# Patient Record
Sex: Female | Born: 1945 | Hispanic: No | Smoking: Never smoker
Health system: Southern US, Community
[De-identification: ages and names within clinical notes are randomized; demographics above are authoritative.]

---

## 2008-05-21 ENCOUNTER — Encounter (INDEPENDENT_AMBULATORY_CARE_PROVIDER_SITE_OTHER): Payer: Self-pay | Admitting: *Deleted

## 2008-05-26 ENCOUNTER — Encounter (INDEPENDENT_AMBULATORY_CARE_PROVIDER_SITE_OTHER): Payer: Self-pay | Admitting: Family Medicine

## 2008-05-26 ENCOUNTER — Ambulatory Visit (HOSPITAL_COMMUNITY): Admission: RE | Admit: 2008-05-26 | Discharge: 2008-05-26 | Payer: Self-pay | Admitting: Family Medicine

## 2008-05-26 ENCOUNTER — Ambulatory Visit: Payer: Self-pay | Admitting: Family Medicine

## 2008-05-26 DIAGNOSIS — R141 Gas pain: Secondary | ICD-10-CM

## 2008-05-26 DIAGNOSIS — R142 Eructation: Secondary | ICD-10-CM

## 2008-05-26 DIAGNOSIS — I10 Essential (primary) hypertension: Secondary | ICD-10-CM

## 2008-05-26 DIAGNOSIS — R143 Flatulence: Secondary | ICD-10-CM

## 2008-05-26 LAB — CONVERTED CEMR LAB
BUN: 13 mg/dL (ref 6–23)
Chloride: 106 meq/L (ref 96–112)
Potassium: 3.6 meq/L (ref 3.5–5.3)

## 2008-05-27 ENCOUNTER — Telehealth: Payer: Self-pay | Admitting: *Deleted

## 2008-06-02 ENCOUNTER — Encounter (INDEPENDENT_AMBULATORY_CARE_PROVIDER_SITE_OTHER): Payer: Self-pay | Admitting: Family Medicine

## 2008-06-02 ENCOUNTER — Ambulatory Visit: Payer: Self-pay | Admitting: Family Medicine

## 2008-06-02 DIAGNOSIS — M1710 Unilateral primary osteoarthritis, unspecified knee: Secondary | ICD-10-CM

## 2008-06-24 ENCOUNTER — Ambulatory Visit: Payer: Self-pay | Admitting: Family Medicine

## 2008-07-30 ENCOUNTER — Ambulatory Visit: Payer: Self-pay | Admitting: Family Medicine

## 2008-07-30 ENCOUNTER — Encounter (INDEPENDENT_AMBULATORY_CARE_PROVIDER_SITE_OTHER): Payer: Self-pay | Admitting: Family Medicine

## 2008-07-30 LAB — CONVERTED CEMR LAB
BUN: 14 mg/dL (ref 6–23)
Chloride: 102 meq/L (ref 96–112)
Glucose, Bld: 86 mg/dL (ref 70–99)
Potassium: 3.4 meq/L — ABNORMAL LOW (ref 3.5–5.3)

## 2008-08-05 ENCOUNTER — Ambulatory Visit: Payer: Self-pay | Admitting: Sports Medicine

## 2008-08-12 ENCOUNTER — Telehealth (INDEPENDENT_AMBULATORY_CARE_PROVIDER_SITE_OTHER): Payer: Self-pay | Admitting: *Deleted

## 2008-08-28 ENCOUNTER — Encounter: Payer: Self-pay | Admitting: Family Medicine

## 2008-09-02 ENCOUNTER — Ambulatory Visit: Payer: Self-pay | Admitting: Sports Medicine

## 2008-12-22 ENCOUNTER — Ambulatory Visit: Payer: Self-pay | Admitting: Family Medicine

## 2009-01-07 ENCOUNTER — Ambulatory Visit: Payer: Self-pay | Admitting: Family Medicine

## 2009-08-31 ENCOUNTER — Encounter: Payer: Self-pay | Admitting: Sports Medicine

## 2009-08-31 ENCOUNTER — Ambulatory Visit: Payer: Self-pay | Admitting: Family Medicine

## 2009-09-08 ENCOUNTER — Encounter: Payer: Self-pay | Admitting: Family Medicine

## 2009-09-08 ENCOUNTER — Ambulatory Visit: Payer: Self-pay | Admitting: Family Medicine

## 2009-09-08 LAB — CONVERTED CEMR LAB
MCHC: 33 g/dL (ref 30.0–36.0)
MCV: 89.9 fL (ref 78.0–100.0)
Platelets: 203 10*3/uL (ref 150–400)
RDW: 14.7 % (ref 11.5–15.5)

## 2009-09-09 ENCOUNTER — Encounter: Payer: Self-pay | Admitting: Family Medicine

## 2009-09-09 LAB — CONVERTED CEMR LAB
Glucose, Urine, Semiquant: NEGATIVE
Specific Gravity, Urine: 1.02
WBC Urine, dipstick: NEGATIVE
pH: 6

## 2009-09-24 ENCOUNTER — Encounter: Payer: Self-pay | Admitting: Family Medicine

## 2009-10-20 ENCOUNTER — Encounter: Payer: Self-pay | Admitting: Family Medicine

## 2009-10-27 ENCOUNTER — Telehealth (INDEPENDENT_AMBULATORY_CARE_PROVIDER_SITE_OTHER): Payer: Self-pay | Admitting: *Deleted

## 2009-11-09 ENCOUNTER — Telehealth (INDEPENDENT_AMBULATORY_CARE_PROVIDER_SITE_OTHER): Payer: Self-pay | Admitting: *Deleted

## 2009-11-19 ENCOUNTER — Encounter
Admission: RE | Admit: 2009-11-19 | Discharge: 2010-01-08 | Payer: Self-pay | Source: Home / Self Care | Admitting: Family Medicine

## 2009-12-09 ENCOUNTER — Encounter: Payer: Self-pay | Admitting: Family Medicine

## 2009-12-29 ENCOUNTER — Encounter: Payer: Self-pay | Admitting: Family Medicine

## 2010-01-26 ENCOUNTER — Encounter
Admission: RE | Admit: 2010-01-26 | Discharge: 2010-02-04 | Payer: Self-pay | Source: Home / Self Care | Attending: Orthopedic Surgery | Admitting: Orthopedic Surgery

## 2010-02-08 ENCOUNTER — Encounter
Admission: RE | Admit: 2010-02-08 | Discharge: 2010-02-24 | Payer: Self-pay | Source: Home / Self Care | Attending: Orthopedic Surgery | Admitting: Orthopedic Surgery

## 2010-02-18 ENCOUNTER — Encounter: Admit: 2010-02-18 | Payer: Self-pay | Admitting: Orthopedic Surgery

## 2010-03-03 ENCOUNTER — Encounter: Payer: Self-pay | Admitting: Family Medicine

## 2010-03-09 NOTE — Progress Notes (Signed)
Summary: PT Referral  Phone Note Call from Patient Call back at Home Phone [phone] Call back at [phone]   Caller: Daughter-Banky  Summary of Call: Checking on PT referral for mother.  Still has not heard anything about this. Initial call taken by: Clydell Hakim,  November 09, 2009 4:03 PM  Follow-up for Phone Call        spoke with PT and they called and left messages  to call back on 09/21 and 09/22 and have not received a call back. called daughter and gave her the phone number to call PT now to schedule. Follow-up by: Theresia Lo RN,  November 09, 2009 4:20 PM

## 2010-03-09 NOTE — Letter (Signed)
Summary: [name] Referral Letter  [facility]  [address]   [place], [place] [address]   Phone: [phone]  Fax: [phone]    08/31/2009  Thank you in advance for agreeing to see my patient:  Sierra Bell [address] 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  Has had 4 steroid injections, they have lasted anywhere between a few weeks to a month but with decreasing efficacy.  She has been on Mobic, Tramadol, acetaminophen, and vicodin without improvement in pain.  The pain symptoms are disabling and not only prevent her from doing what she would like but also wake her from sleep.  Procedures Requested: Bilateral total knee arthrplasty   Current Medications: 1)  MOBIC 15 MG TABS (MELOXICAM) one by mouth daily 2)  MULTIVITAMINS  TABS (MULTIPLE VITAMIN) 2 tablets by mouth once daily 3)  ZESTORETIC 20-12.5 MG TABS (LISINOPRIL-HYDROCHLOROTHIAZIDE) 2 tablets daily 4)  TRAMADOL HCL 50 MG TABS (TRAMADOL HCL) take 2-4 times a day as needed pain 5)  ACETAMINOPHEN 650 MG CR-TABS (ACETAMINOPHEN) One tab by mouth q8h   Past Medical History: 1)  HTN 2)  [id number] 3) Bilateral knee DJD    Thank you again for agreeing to see our patient; please contact us if you have any further questions or need additional information.   The patient has been told to obtain a CD with copies of her xrays for review at your office.  Sincerely,  Rodney Langton MD

## 2010-03-09 NOTE — Consult Note (Signed)
Summary: [facility] - Office visit  [facility] - Office visit   Imported By: De Nurse 12/16/2009 15:34:20  _____________________________________________________________________  External Attachment:    Type:   Image     Comment:   External Document

## 2010-03-09 NOTE — Assessment & Plan Note (Signed)
Summary: fever and chills,  ??malaria??  call [facility], # in instructions   Vital Signs:  Patient profile:   65 year old female Weight:      139.9 pounds Temp:     98.1 degrees F oral Pulse rate:   80 / minute Pulse rhythm:   regular BP sitting:   146 / 86  (left arm) Cuff size:   regular  Vitals Entered By: Loralee Pacas CMA (September 08, 2009 4:17 PM)  Primary Care Provider:  Jamie Brookes MD  CC:  fever and Chills.  History of Present Illness: Fever and Chills Pt has just returned from being in [place] for 4-5 months. She has been back about 1 month. Her daughter comes with her today and translated for her.  She has been having stomach upset, nausea but no vomiting, dizziness, malaise, fever and chills for the last 2 days. She stayed in bed all day on Sunday and was too weak to get up. She only has fever and chills at night. She did not take anti-malarial prophylaxis when she went to [place]. She mentions that this feels like she did when she had malarie in her younger years.   Current Medications (verified): 1)  Mobic 15 Mg Tabs (Meloxicam) .... One By Mouth Daily 2)  Multivitamins  Tabs (Multiple Vitamin) .... 2 Tablets By Mouth Once Daily 3)  Zestoretic 20-12.5 Mg Tabs (Lisinopril-Hydrochlorothiazide) .... 2 Tablets Daily 4)  Tramadol Hcl 50 Mg Tabs (Tramadol Hcl) .... Take 2-4 Times A Day As Needed Pain 5)  Acetaminophen 650 Mg Cr-Tabs (Acetaminophen) .... One Tab By Mouth Q8h 6)  Mefloquine Hcl 250 Mg Tabs (Mefloquine Hcl) .... Take 5 Pills All At One Time Tonight or Tomorrow. Take With Food.  Allergies (verified): 1)  ! Quinine  Review of Systems        vitals reviewed and pertinent negatives and positives seen in HPI   Physical Exam  General:  Well-developed,well-nourished,in no acute distress; alert,appropriate and cooperative throughout examination Lungs:  Normal respiratory effort, chest expands symmetrically. Lungs are clear to auscultation, no crackles or  wheezes. Heart:  Normal rate and regular rhythm. S1 and S2 normal without gallop, murmur, click, rub or other extra sounds. Abdomen:  no hepatomegaly and no splenomegaly.     Impression & Recommendations:  Problem # 1:  FEVER UNSPECIFIED (ICD-780.60) Assessment New Concern to Malaria since pt just returned from [place] about 30 days ago, has had symptoms like this before when she had malaria, has only night fever and chills. Could also be viral illness. Plan to get CBC, Thick and Thin smears for malaria, get a urine incase this is a simple UTI although denies any urinary symptoms, Pt got Rx for Mefloquine and will   Orders: CBC-[facility] (56213) Miscellaneous Lab Charge-[facility] (08657) [facility]- Est Level  3 (99213)Future Orders: Urinalysis-[facility] (00000) ... 09/09/2009  Complete Medication List: 1)  Mobic 15 Mg Tabs (Meloxicam) .... One by mouth daily 2)  Multivitamins Tabs (Multiple vitamin) .... 2 tablets by mouth once daily 3)  Zestoretic 20-12.5 Mg Tabs (Lisinopril-hydrochlorothiazide) .... 2 tablets daily 4)  Tramadol Hcl 50 Mg Tabs (Tramadol hcl) .... Take 2-4 times a day as needed pain 5)  Acetaminophen 650 Mg Cr-tabs (Acetaminophen) .... One tab by mouth q8h 6)  Mefloquine Hcl 250 Mg Tabs (Mefloquine hcl) .... Take 5 pills all at one time tonight or tomorrow. take with food.  Patient Instructions: 1)  Pt is going to wait to take the Mefloquine. She has  the Rx, she will fill it if she is found to have malaria. 2)  Pt is going to drop off urine tomorrow morning as she was not able to urinate here.  3)  I will call patient's daughter at [phone] (home) or [phone] (cell) with results.  Prescriptions: MEFLOQUINE HCL 250 MG TABS (MEFLOQUINE HCL) take 5 pills all at one time tonight or tomorrow. Take with food.  #5 x 0   Entered and Authorized by:   Jamie Brookes MD   Signed by:   Jamie Brookes MD on 09/08/2009   Method used:   Print then Give to Patient   RxID:   [id number]

## 2010-03-09 NOTE — Consult Note (Signed)
Summary: [facility]  [facility]   Imported By: Marily Memos 10/08/2009 09:28:48  _____________________________________________________________________  External Attachment:    Type:   Image     Comment:   External Document

## 2010-03-09 NOTE — Consult Note (Signed)
Summary: [facility]   Imported By: De Nurse 04/15/2009 12:14:47  _____________________________________________________________________  External Attachment:    Type:   Image     Comment:   External Document

## 2010-03-09 NOTE — Assessment & Plan Note (Signed)
Summary: KNEE PAIN/MJD   Vital Signs:  Patient profile:   65 year old female Height:      64 inches BP sitting:   170 / 90  (left arm) Cuff size:   regular  Vitals Entered By: Tessie Fass CMA (August 31, 2009 4:29 PM) CC: bilateral knee pain   Primary Care Provider:  Jamie Brookes MD  CC:  bilateral knee pain.  History of Present Illness: Patient is a 65 yo [place] female who presents for follow-up of bilateral knee pain. Ongoing x 10 years.  L knee still more painful that the right. Pain worse with getting up from a sitting position. Present at night and interferes fairly significantly with her sleep.   Has had 3 steroid injections, they have lasted anywhere between a few weeks to 2 month.  She has been on Mobic, Tramadol, and vicodin without improvement in pain.  She doesn't have insurance and thus hasn't been able to see an orthopaedist or try synvisc/supartz injections.  She would like to try another steroid injection today  Current Medications (verified): 1)  Mobic 15 Mg Tabs (Meloxicam) .... One By Mouth Daily 2)  Multivitamins  Tabs (Multiple Vitamin) .... 2 Tablets By Mouth Once Daily 3)  Zestoretic 20-12.5 Mg Tabs (Lisinopril-Hydrochlorothiazide) .... 2 Tablets Daily 4)  Tramadol Hcl 50 Mg Tabs (Tramadol Hcl) .... Take 2-4 Times A Day As Needed Pain 5)  Acetaminophen 650 Mg Cr-Tabs (Acetaminophen) .... One Tab By Mouth Q8h  Allergies (verified): 1)  ! Quinine  Review of Systems  The patient denies fever.         See HPI  Physical Exam  General:  Well-developed,well-nourished,in no acute distress; alert,appropriate and cooperative throughout examination Msk:  Knee: Bilateral Inspection reveals bilateral genu varum. Knees:  no warmth There is B r joint line tenderness medially > laterally.  lack full extension 20 degrees B,lack full flexion 10-15 degrees B Ligaments with solid consistent endpoints including ACL, PCL, LCL, MCL. Negative Mcmurray's and  provocative meniscal tests. Non painful patellar compression. Patellar and quadriceps tendons unremarkable. Hamstring and quadriceps strength is normal.   Additional Exam:  Consent obtained and verified. Sterile betadine prep. Furthur cleansed with alcohol. Topical analgesic spray: Ethyl chloride. Joint: Bilateral knees Completed without difficulty Meds: 1cc kenalog 40, 5cc lidocaine in each knee. Needle:25g Aftercare instructions and Red flags advised.     Impression & Recommendations:  Problem # 1:  OSTEOARTHRITIS, KNEES, BILATERAL (ICD-715.96) Assessment Deteriorated Injected knees today.She has maximized medical therapy, her only other option at this point is TKA.  As she doesn't have insurance we will try a referral to [facility].  Pt advised to get CD of knee xrays from radiology for [facility] appt.   Will go ahead and add acetaminophen to her meds.  Orders: Orthopedic Surgeon Referral (Ortho Surgeon) Joint Aspirate / Injection, Large [phone]) Kenalog 10 mg inj (J3301)  Complete Medication List: 1)  Mobic 15 Mg Tabs (Meloxicam) .... One by mouth daily 2)  Multivitamins Tabs (Multiple vitamin) .... 2 tablets by mouth once daily 3)  Zestoretic 20-12.5 Mg Tabs (Lisinopril-hydrochlorothiazide) .... 2 tablets daily 4)  Tramadol Hcl 50 Mg Tabs (Tramadol hcl) .... Take 2-4 times a day as needed pain 5)  Acetaminophen 650 Mg Cr-tabs (Acetaminophen) .... One tab by mouth q8h  Patient Instructions: 1)  Great to meet you, 2)  Injected both knees. 3)  Referral to orthopaedic surgery at [facility]. 4)  You need to go to radiology and get a CD  of your knee xrays to take to the orthopaedic surgeon when you get your appt. 5)  Come back to see [place] as needed. 6)  -Dr. Benjamin Stain. Prescriptions: MOBIC 15 MG TABS (MELOXICAM) one by mouth daily  #30 x 0   Entered by:   Rodney Langton MD   Authorized by:   Denny Levy MD   Signed by:   Rodney Langton MD on 08/31/2009   Method  used:   Electronically to        [facility] [phone]* (retail)       [address]       [place], [place]  [address]       Ph: [phone]       Fax: [phone]   RxID:   [id number] TRAMADOL HCL 50 MG TABS (TRAMADOL HCL) take 2-4 times a day as needed pain  #120 x 0   Entered by:   Rodney Langton MD   Authorized by:   Denny Levy MD   Signed by:   Rodney Langton MD on 08/31/2009   Method used:   Faxed to ...       [facility] (retail)       [address] [place], [place]  [address]       Ph: [phone]       Fax: [phone]   RxID:   [id number] ACETAMINOPHEN 650 MG CR-TABS (ACETAMINOPHEN) One tab by mouth q8h  #60 x 0   Entered by:   Rodney Langton MD   Authorized by:   Denny Levy MD   Signed by:   Rodney Langton MD on 08/31/2009   Method used:   Print then Give to Patient   RxID:   [id number]

## 2010-03-09 NOTE — Consult Note (Signed)
Summary: [facility]   Imported By: Sierra Bell 10/28/2009 15:18:52  _____________________________________________________________________  External Attachment:    Type:   Image     Comment:   External Document

## 2010-03-09 NOTE — Progress Notes (Signed)
Summary: phn msg  Phone Note Call from Patient Call back at [phone]   Caller: Daughter-Banke Summary of Call: Pt had knee replacment surgery this past weekend at [facility].  Will need to have PT set up through [place] so it will be closer to home.  Pt has [employer]. Initial call taken by: Clydell Hakim,  October 27, 2009 8:41 AM  Follow-up for Phone Call        spoke to patient and advised her to have her orthopedist call and give referral for PT . gave her the fax number and phone number. because she has orange card she should  be able to go to [facility].   called PT to verify this and they will speak with billing dept to make sure and call me back. Follow-up by: Theresia Lo RN,  October 27, 2009 11:43 AM  Additional Follow-up for Phone Call Additional follow up Details #1::        received call back from PT and was told that referral will need to come from [place] since patient has the orange card. will forward to Dr. Jennette Kettle since she saw this patient in [facility] and made referral to [facility].    Dr. Jana Half orthopedic surgeon # [phone]. Additional Follow-up by: Theresia Lo RN,  October 27, 2009 3:06 PM      Complete Medication List: 1)  Mobic 15 Mg Tabs (Meloxicam) .... One by mouth daily 2)  Multivitamins Tabs (Multiple vitamin) .... 2 tablets by mouth once daily 3)  Zestoretic 20-12.5 Mg Tabs (Lisinopril-hydrochlorothiazide) .... 2 tablets daily 4)  Tramadol Hcl 50 Mg Tabs (Tramadol hcl) .... Take 2-4 times a day as needed pain 5)  Acetaminophen 650 Mg Cr-tabs (Acetaminophen) .... One tab by mouth q8h 6)  Mefloquine Hcl 250 Mg Tabs (Mefloquine hcl) .... Take 5 pills all at one time tonight or tomorrow. take with food. 7)  Cephalexin 500 Mg Caps (Cephalexin) .... Take 1 cap every 12 hours for 10 days to treat uti.  referral faxed to [facility].and daughter notified. Theresia Lo RN  October 27, 2009 4:24 PM

## 2010-03-10 ENCOUNTER — Encounter: Payer: Self-pay | Admitting: *Deleted

## 2010-03-11 NOTE — Miscellaneous (Signed)
Summary: [facility]    PT discharge  [facility]   Imported By: Marily Memos 01/18/2010 10:40:33  _____________________________________________________________________  External Attachment:    Type:   Image     Comment:   External Document

## 2010-03-17 NOTE — Consult Note (Signed)
Summary: [facility]- Office visit  [facility]- Office visit   Imported By: De Nurse 03/05/2010 16:05:31  _____________________________________________________________________  External Attachment:    Type:   Image     Comment:   External Document

## 2010-04-30 ENCOUNTER — Ambulatory Visit (INDEPENDENT_AMBULATORY_CARE_PROVIDER_SITE_OTHER): Payer: Self-pay | Admitting: Family Medicine

## 2010-04-30 ENCOUNTER — Encounter: Payer: Self-pay | Admitting: Family Medicine

## 2010-04-30 DIAGNOSIS — R141 Gas pain: Secondary | ICD-10-CM

## 2010-04-30 DIAGNOSIS — R142 Eructation: Secondary | ICD-10-CM

## 2010-04-30 DIAGNOSIS — J309 Allergic rhinitis, unspecified: Secondary | ICD-10-CM

## 2010-04-30 DIAGNOSIS — J302 Other seasonal allergic rhinitis: Secondary | ICD-10-CM | POA: Insufficient documentation

## 2010-04-30 MED ORDER — SIMETHICONE 80 MG PO CHEW: 80.0000 mg | CHEWABLE_TABLET | Freq: Four times a day (QID) | ORAL | Status: AC | PRN

## 2010-04-30 MED ORDER — CETIRIZINE HCL 10 MG PO TABS: 10.0000 mg | ORAL_TABLET | Freq: Every day | ORAL | Status: DC

## 2010-04-30 MED ORDER — OLOPATADINE HCL 0.2 % OP SOLN: 1.0000 [drp] | Freq: Every day | OPHTHALMIC | Status: AC

## 2010-04-30 NOTE — Assessment & Plan Note (Signed)
No masses, I suspect this is all gas pain. Plan to treat with gas-x.

## 2010-04-30 NOTE — Progress Notes (Signed)
  Subjective:    Patient ID: Sierra Bell, female    DOB: 1945/08/04, 65 y.o.   MRN: [id number]  HPI Pt's son interprets for her as she is from an [place] country.  Eye itching: Pt  Has been having some eye itching the last 2 weeks. She has been having watery eyes sometimes and sometimes they itch and feel like something is in them. She has had some nasal drainage as well. She does not have signs of infection.   Abd pain: Pt is having some diffuse moving abdominal pain with loud noises moving through her abdomen. When asked about gas she says she has a lot of gas and it causes pain when moving in her stomach. She says the smell is horrible too!   Review of Systems Neg except as noted in HPI    Objective:   Physical Exam Gen: comfortably sitting in chair. HEENT: eye exam is normal with normal cup to disc ratio, however the patient has allergic punctate bumps on her lower lids, some nasal congestion, no cervical LAD, no erytehm in her post-pharynx, Abd: Pt has diffuse tenderness in her abdomen, no masses, no organomegaly.        Assessment & Plan:

## 2010-04-30 NOTE — Patient Instructions (Signed)
You stomach pain and noise is due to gas moving through the colon. Take simethicone to relieve the gas pain.  Take cetirizine daily to control allergies and use the eye drops to treat allergies that cause eye itching and pain.

## 2010-04-30 NOTE — Assessment & Plan Note (Signed)
Plan to treat with Zyrtec and Pataday drops.

## 2010-05-12 ENCOUNTER — Encounter: Payer: Self-pay | Admitting: Home Health Services

## 2010-11-15 ENCOUNTER — Other Ambulatory Visit: Payer: Self-pay | Admitting: Family Medicine

## 2010-11-15 MED ORDER — TRAMADOL HCL 50 MG PO TABS
50.0000 mg | ORAL_TABLET | Freq: Four times a day (QID) | ORAL | Status: AC | PRN
Start: 2010-11-15 — End: ?

## 2010-11-17 ENCOUNTER — Ambulatory Visit (INDEPENDENT_AMBULATORY_CARE_PROVIDER_SITE_OTHER): Payer: Self-pay | Admitting: Family Medicine

## 2010-11-17 ENCOUNTER — Ambulatory Visit: Payer: Self-pay | Admitting: Family Medicine

## 2010-11-17 ENCOUNTER — Encounter: Payer: Self-pay | Admitting: Family Medicine

## 2010-11-17 VITALS — BP 146/74 | HR 74 | Temp 98.0°F | Wt 147.0 lb

## 2010-11-17 DIAGNOSIS — H543 Unqualified visual loss, both eyes: Secondary | ICD-10-CM

## 2010-11-17 DIAGNOSIS — H539 Unspecified visual disturbance: Secondary | ICD-10-CM

## 2010-11-17 NOTE — Progress Notes (Signed)
  Subjective:    Patient ID: Sierra Bell, female    DOB: 23-Oct-1945, 65 y.o.   MRN: [id number]  HPI 65 year old female presents today as a work in appointment for 2 months of worsening vision.  She is accompanied by her daughter-in-law who serves as a Nurse, learning disability.  Patient states she has had chronically declining vision for the past 5 years. In the past 2 months she notes worsening bilateral vision loss. History is difficult to obtain. She states that at times she has complete vision loss loss in both eyes. These episodes last for 1-2 hours at a time. She has no other symptoms of numbness weakness tingling slurred speech or headaches during these episodes. They spontaneously self resolve. Patient also states that at other times it is as if a curtain obscures part of her vision. She also endorses that sometimes she has blurry vision like someone had smeared well over her eyes.  She reports no eye pain but does have watery eyes. They cannot report any recent eye exam  Patient states that otherwise she is in her usual state of health   Review of Systems no fevers chills, weight loss. She denies any flashes, floaters, double vision.     Objective:   Physical Exam  GEN: Alert & Oriented, No acute distress Eye: EOMI, PERRLA, no conjunctival injection.  Undilated exam reveals mild corneal opacity, fundoscopic shows no obvious exudate, papilledema or hemorrhage.  Peripheral vision is poor in all quadrants. CV:  Regular Rate & Rhythm, no murmur Respiratory:  Normal work of breathing, CTAB Abd:  + BS, soft, no tenderness to palpation Ext: no pre-tibial edema Neuro: CN 2-12 intact.  No focal deficits.  Strength 5/5 in upper and lower extremities.  Biceps and patellar reflexes 2+ bilaterally.      Assessment & Plan:

## 2010-11-17 NOTE — Assessment & Plan Note (Signed)
I suspect that patient's symptoms of blurriness are likely due to mild progressive bilateral cataracts. History is difficult to obtain even after repeated questioning. Given that she is firm in her history of bilateral "blacking out" of vision I feel we need to pursue further evaluation with MRI with contrast. This has been scheduled as we await ophthalmology consultation as patient is uninsured.  I have advised patient to make a followup appointment with her PCP P. in 1-2 weeks to review risk factors in optimal health maintenance. They can review results of MRI at that time.

## 2010-11-17 NOTE — Patient Instructions (Addendum)
Will put you on the waiting list to see an ophthalmologist. Will send you for an MRI to make sure this is not a stroke causing your symptoms If you notice signs like flashing lights, sudden vision loss, numbness, weakness, tingling, please seek medical care urgently.  Needs to make a follow-up appointment to meet your family doctor

## 2010-11-18 LAB — COMPREHENSIVE METABOLIC PANEL
ALT: 10 U/L (ref 0–35)
Albumin: 4.1 g/dL (ref 3.5–5.2)
BUN: 17 mg/dL (ref 6–23)
CO2: 29 mEq/L (ref 19–32)
Calcium: 9.2 mg/dL (ref 8.4–10.5)
Chloride: 100 mEq/L (ref 96–112)
Creat: 0.93 mg/dL (ref 0.50–1.10)

## 2010-11-24 ENCOUNTER — Ambulatory Visit (HOSPITAL_COMMUNITY)
Admission: RE | Admit: 2010-11-24 | Discharge: 2010-11-24 | Disposition: A | Discharge status: Home / Self Care | Payer: Self-pay | Source: Ambulatory Visit | Attending: Family Medicine | Admitting: Family Medicine

## 2010-11-24 DIAGNOSIS — H543 Unqualified visual loss, both eyes: Secondary | ICD-10-CM | POA: Insufficient documentation

## 2010-11-24 MED ORDER — GADOBENATE DIMEGLUMINE 529 MG/ML IV SOLN: 15.0000 mL | Freq: Once | INTRAVENOUS | Status: DC

## 2010-11-26 ENCOUNTER — Telehealth: Payer: Self-pay | Admitting: *Deleted

## 2010-11-26 NOTE — Telephone Encounter (Signed)
Spoke with patient's interpreter and informed her of the MRI results being normal and gave an update on the Opthalmology referral. Informed her that being that the patient has the orange card it may take some time for the answer on the referral. She agreed and was fine with that.Sierra Bell

## 2010-11-26 NOTE — Telephone Encounter (Signed)
Message copied by Farrell Ours on Fri Nov 26, 2010  8:33 AM ------      Message from: FLEEGER, JESSICA D      Created: Thu Nov 25, 2010  5:48 PM                   ----- Message -----         From: Delbert Harness, MD         Sent: 11/25/2010   9:07 AM           To: [facility]            Can you please let patient know her MRI was normal and give her an update on the status of her ophthomology referral            Thanks!

## 2010-12-02 ENCOUNTER — Ambulatory Visit: Payer: Self-pay | Admitting: Family Medicine

## 2010-12-03 ENCOUNTER — Encounter: Payer: Self-pay | Admitting: Family Medicine

## 2010-12-03 ENCOUNTER — Ambulatory Visit (INDEPENDENT_AMBULATORY_CARE_PROVIDER_SITE_OTHER): Payer: Self-pay | Admitting: Family Medicine

## 2010-12-03 VITALS — BP 114/68 | HR 74 | Temp 98.6°F | Wt 148.0 lb

## 2010-12-03 DIAGNOSIS — Z23 Encounter for immunization: Secondary | ICD-10-CM

## 2010-12-03 DIAGNOSIS — R509 Fever, unspecified: Secondary | ICD-10-CM

## 2010-12-03 DIAGNOSIS — N3090 Cystitis, unspecified without hematuria: Secondary | ICD-10-CM

## 2010-12-03 DIAGNOSIS — N951 Menopausal and female climacteric states: Secondary | ICD-10-CM

## 2010-12-03 DIAGNOSIS — R232 Flushing: Secondary | ICD-10-CM

## 2010-12-03 LAB — POCT URINALYSIS DIPSTICK
Glucose, UA: NEGATIVE
Leukocytes, UA: NEGATIVE
Nitrite, UA: NEGATIVE
Protein, UA: NEGATIVE
Spec Grav, UA: 1.015
Urobilinogen, UA: 1

## 2010-12-03 LAB — CBC WITH DIFFERENTIAL/PLATELET
Basophils Absolute: 0 10*3/uL (ref 0.0–0.1)
HCT: 37.1 % (ref 36.0–46.0)
Hemoglobin: 11.9 g/dL — ABNORMAL LOW (ref 12.0–15.0)
Lymphocytes Relative: 48 % — ABNORMAL HIGH (ref 12–46)
Monocytes Absolute: 0.4 10*3/uL (ref 0.1–1.0)
Neutro Abs: 2 10*3/uL (ref 1.7–7.7)
Neutrophils Relative %: 40 % — ABNORMAL LOW (ref 43–77)
RDW: 13.4 % (ref 11.5–15.5)
WBC: 4.9 10*3/uL (ref 4.0–10.5)

## 2010-12-03 LAB — POCT UA - MICROSCOPIC ONLY

## 2010-12-03 LAB — TSH: TSH: 0.65 u[IU]/mL (ref 0.350–4.500)

## 2010-12-03 NOTE — Assessment & Plan Note (Addendum)
Chills and fever associated with urinary frequency and polyuria as well as abdominal cramping around the bladder. Will start with a CBC with diff to check for markers of infection as well as U/A for cystitis/UTI. She has no fever today and is in no distress.

## 2010-12-03 NOTE — Patient Instructions (Signed)
It was great seeing you today.  I will call you if your tests are not good.  Otherwise I will send you a letter.  If you do not hear from me with in 2 weeks please call our office.     I think your symptoms are coming from cystitis. Urinary Tract Infection A urinary tract infection (UTI) is often caused by a germ (bacteria). A UTI is usually helped with medicine (antibiotics) that kills germs. Take all the medicine until it is gone. Do this even if you are feeling better. You are usually better in 7 to 10 days. HOME CARE    Drink enough water and fluids to keep your pee (urine) clear or pale yellow. Drink:     Cranberry juice.     Water.    Avoid:    Caffeine.    Tea.    Bubbly (carbonated) drinks.     Alcohol.    Only take medicine as told by your doctor.     To prevent further infections:     Pee often.     After pooping (bowel movement), women should wipe from front to back. Use each tissue only once.     Pee before and after having sex (intercourse).  Ask your doctor when your test results will be ready. Make sure you follow up and get your test results.   GET HELP RIGHT AWAY IF:    There is very bad back pain or lower belly (abdominal) pain.     You get the chills.     You have a fever.     Your baby is older than 3 months with a rectal temperature of 102 F (38.9 C) or higher.     Your baby is 55 months old or younger with a rectal temperature of 100.4 F (38 C) or higher.     You feel sick to your stomach (nauseous) or throw up (vomit).     There is continued burning with peeing.     Your problems are not better in 3 days. Return sooner if you are getting worse.  MAKE SURE YOU:    Understand these instructions.     Will watch your condition.     Will get help right away if you are not doing well or get worse.  Document Released: 07/13/2007 Document Revised: 10/06/2010 Document Reviewed: 07/13/2007 [facility] Patient Information 2012 [place], [place].

## 2010-12-03 NOTE — Assessment & Plan Note (Signed)
Symptoms that sound like hot flashes. But not clear: poor historian.  Will see back in one week.

## 2010-12-03 NOTE — Assessment & Plan Note (Signed)
Patient has polyuria with frequency and chills/fever, and abdominal pain. I don't think this is GI/Gallbladder because no stool changes or food association.  Will check U/A and CBC with diff

## 2010-12-03 NOTE — Progress Notes (Signed)
  Subjective:    Patient ID: Sierra Bell, female    DOB: 06-Jun-1945, 65 y.o.   MRN: [id number]  HPI Poor historian - [place] with family member as Nurse, learning disability.  1. Hot Flashes Describes intense burning/heat sensation arising from abdomen up into trunk and arms that subsides after 30 minutes and then chills. This has apparantly been going on for the last two years. No associated symptoms. No recent travel. No history of hot flashes before. No thyroid studies done. No new medications or allergies. No rash  2. Polyuria Complains of having to urinate 7 times per day. No dysuria. No hematuria. No flank pain. Does have fevers per patient report - 100 f  No history of DM.   Review of Systems See HPI    Objective:   Physical Exam Lungs:  Normal respiratory effort, chest expands symmetrically. Lungs are clear to auscultation, no crackles or wheezes. Heart - Regular rate and rhythm.  No murmurs, gallops or rubs.    Extremities:  No cyanosis, edema, or deformity noted with good range of motion of all major joints.   Abdomen: soft and non-tender without masses, organomegaly or hernias noted.  No guarding or rebound Filed Vitals:   12/03/10 1025  BP: 114/68  Pulse: 74  Temp: 98.6 F (37 C)  Weight: 148 lb (67.132 kg)      Assessment & Plan:

## 2010-12-08 ENCOUNTER — Encounter: Payer: Self-pay | Admitting: Family Medicine

## 2010-12-08 ENCOUNTER — Ambulatory Visit (INDEPENDENT_AMBULATORY_CARE_PROVIDER_SITE_OTHER): Payer: Self-pay | Admitting: Family Medicine

## 2010-12-08 VITALS — BP 109/70 | HR 75 | Temp 98.4°F | Wt 150.0 lb

## 2010-12-08 DIAGNOSIS — R1084 Generalized abdominal pain: Secondary | ICD-10-CM | POA: Insufficient documentation

## 2010-12-08 DIAGNOSIS — B820 Intestinal helminthiasis, unspecified: Secondary | ICD-10-CM

## 2010-12-08 DIAGNOSIS — H539 Unspecified visual disturbance: Secondary | ICD-10-CM

## 2010-12-08 DIAGNOSIS — N951 Menopausal and female climacteric states: Secondary | ICD-10-CM

## 2010-12-08 DIAGNOSIS — M129 Arthropathy, unspecified: Secondary | ICD-10-CM

## 2010-12-08 DIAGNOSIS — H547 Unspecified visual loss: Secondary | ICD-10-CM

## 2010-12-08 DIAGNOSIS — R109 Unspecified abdominal pain: Secondary | ICD-10-CM

## 2010-12-08 DIAGNOSIS — M1990 Unspecified osteoarthritis, unspecified site: Secondary | ICD-10-CM

## 2010-12-08 DIAGNOSIS — R232 Flushing: Secondary | ICD-10-CM

## 2010-12-08 MED ORDER — ALBENDAZOLE 200 MG PO TABS: 400.0000 mg | ORAL_TABLET | Freq: Two times a day (BID) | ORAL | Status: AC

## 2010-12-08 MED ORDER — ACETAMINOPHEN 500 MG PO TABS: 1000.0000 mg | ORAL_TABLET | Freq: Four times a day (QID) | ORAL | Status: AC | PRN

## 2010-12-08 MED ORDER — MELOXICAM 7.5 MG PO TABS: 7.5000 mg | ORAL_TABLET | Freq: Every day | ORAL | Status: DC

## 2010-12-08 NOTE — Assessment & Plan Note (Signed)
Unusual presentation and time for this to be afflicting her. I am not convinced. Will try tylenol PRN these episodes to see if this isn't a febrile reaction vs. Some other pathology.

## 2010-12-08 NOTE — Progress Notes (Signed)
  Subjective:    Patient ID: Sierra Bell, female    DOB: 08-07-1945, 65 y.o.   MRN: [id number]  HPI 1. Abdominal discomfort Generalized. No pain. No tenderness. Described as gurgling and rumbling of intestines. No flank pain. Associated with hot flashes and chills following. Correlated with eating soup in the evenings. No recent travel. No diarrhea/constipation. No blood per rectum/vagina. No vaginal or bladder symptoms.   Labs reviewed from last visit: CBC/CMP/TSH/U/A all WNL.  2. Hot flashes Patient says she has abdominal discomfort followed by her skin feeling like its on fire followed by chills. Never had hot flashes when newly post-menopausal. No rash or skin changes. No sensitivity.   3. Osteoarthritis of hands Bilateral joint pains in DIP/PIP joints. No swelling. Worse in the morning. No range of motion issues. C/o numbness.  No RA history. No other joints affected. No weight loss. No insect bites or rashes.  Review of Systems See HPI    Objective:   Physical Exam  Vitals reviewed. Constitutional: She appears well-developed and well-nourished. No distress.  Cardiovascular: Normal rate, regular rhythm and normal heart sounds.   No murmur heard. Pulmonary/Chest: Breath sounds normal. She is in respiratory distress. She has no wheezes.  Abdominal: Soft. She exhibits no distension. There is no tenderness. There is no rebound.  Musculoskeletal: Normal range of motion. She exhibits no edema and no tenderness.  Skin: No rash noted. She is not diaphoretic.   Filed Vitals:   12/08/10 1101  BP: 109/70  Pulse: 75  Temp: 98.4 F (36.9 C)  Weight: 150 lb (68.04 kg)      Assessment & Plan:

## 2010-12-08 NOTE — Patient Instructions (Signed)
I will call you if your tests are not good.  Otherwise I will send you a letter.  If you do not hear from me with in 2 weeks please call our office.     I will see you again in two weeks.  Expect a call for your opthalmology referral.  Try tylenol for your symptoms.

## 2010-12-08 NOTE — Assessment & Plan Note (Signed)
MRI of brain negative for any pathology. Referral to Opthalmology placed. Will follow up with me in two weeks. No vision changes since last eval.

## 2010-12-08 NOTE — Assessment & Plan Note (Signed)
Difficult history. Patient worried about worms as a cause of her symptoms. She was in [place] one year ago. Advised her that symptoms do not suggest worms. Will treat prophylactically because of exposure history.

## 2010-12-08 NOTE — Assessment & Plan Note (Signed)
Mobic QD for osteoarthritis of DIP/PIP Do not think this is RA or other rheum. Dx Will follow up in 2 weeks.

## 2010-12-22 ENCOUNTER — Ambulatory Visit: Payer: Self-pay | Admitting: Family Medicine

## 2010-12-29 ENCOUNTER — Encounter: Payer: Self-pay | Admitting: Family Medicine

## 2010-12-29 ENCOUNTER — Ambulatory Visit (INDEPENDENT_AMBULATORY_CARE_PROVIDER_SITE_OTHER): Payer: Self-pay | Admitting: Family Medicine

## 2010-12-29 DIAGNOSIS — H547 Unspecified visual loss: Secondary | ICD-10-CM

## 2010-12-29 DIAGNOSIS — G479 Sleep disorder, unspecified: Secondary | ICD-10-CM

## 2010-12-29 DIAGNOSIS — I10 Essential (primary) hypertension: Secondary | ICD-10-CM

## 2010-12-29 DIAGNOSIS — Z23 Encounter for immunization: Secondary | ICD-10-CM

## 2010-12-29 MED ORDER — HYDROCHLOROTHIAZIDE 25 MG PO TABS: 25.0000 mg | ORAL_TABLET | Freq: Every day | ORAL | Status: DC

## 2010-12-29 MED ORDER — MELATONIN 5 MG PO TABS: 5.0000 mg | ORAL_TABLET | Freq: Every evening | ORAL | Status: AC | PRN

## 2010-12-29 MED ORDER — PNEUMOCOCCAL VAC POLYVALENT 25 MCG/0.5ML IJ INJ: 0.5000 mL | INJECTION | Freq: Once | INTRAMUSCULAR | Status: AC

## 2010-12-29 MED ORDER — LISINOPRIL 40 MG PO TABS: 40.0000 mg | ORAL_TABLET | Freq: Every day | ORAL | Status: DC

## 2010-12-29 NOTE — Assessment & Plan Note (Signed)
Sleep seems decreased related to age. Having to get up twice to urinate as well. Advised to decrease fluid intake in the evenings. Melatonin for sleep.

## 2010-12-29 NOTE — Assessment & Plan Note (Signed)
BP well controlled. C/o chronic vision changes. Referral to optho. Refilled medications.

## 2010-12-29 NOTE — Progress Notes (Signed)
  Subjective:    Patient ID: Sierra Bell, female    DOB: Jan 12, 1946, 65 y.o.   MRN: [id number]  HPI 1. HTN Hypertension ROS: taking medications as instructed, no medication side effects noted, no TIA's, no chest pain on exertion, no dyspnea on exertion, no swelling of ankles and no palpitations.  New concerns: none. However visual impairment still an issue. Did not see ophthalmologist as referred because of insurance issues.   2. Sleep disturbance Goes to sleep at 9:30pm, able to fall asleep fine. Awakens at 5 am. She wakes up twice to urinate. She is able to hold her urine, she has no urgency or leakage. Good sleep hygiene. No caffeine intake. Does drink fluids at night. Is on HCTZ.   Review of Systems No fevers, chills, night sweats, weight loss.     Objective:   Physical Exam BP 132/84  Pulse 74  Temp(Src) 97.5 F (36.4 C) (Oral)  Ht 5\' 4"  (1.626 m)  Wt 153 lb (69.4 kg)  BMI 26.26 kg/m2 Appearance alert, well appearing, and in no distress. General exam BP noted to be well controlled today in office, S1, S2 normal, no gallop, no murmur, chest clear, no JVD, no HSM, no edema.  Lab review: labs are reviewed, up to date and normal.  Lungs:  Normal respiratory effort, chest expands symmetrically. Lungs are clear to auscultation, no crackles or wheezes.     Assessment & Plan:

## 2010-12-29 NOTE — Patient Instructions (Signed)
It was great to see you today!  Schedule an appointment to see me as needed.  

## 2011-01-05 ENCOUNTER — Encounter: Payer: Self-pay | Admitting: Home Health Services

## 2011-02-22 ENCOUNTER — Ambulatory Visit: Payer: Self-pay | Admitting: Sports Medicine

## 2011-02-23 ENCOUNTER — Ambulatory Visit (INDEPENDENT_AMBULATORY_CARE_PROVIDER_SITE_OTHER): Payer: Self-pay | Admitting: Sports Medicine

## 2011-02-23 ENCOUNTER — Encounter: Payer: Self-pay | Admitting: Sports Medicine

## 2011-02-23 VITALS — BP 116/74 | HR 76 | Ht 64.0 in | Wt 160.0 lb

## 2011-02-23 DIAGNOSIS — G5601 Carpal tunnel syndrome, right upper limb: Secondary | ICD-10-CM

## 2011-02-23 DIAGNOSIS — M25539 Pain in unspecified wrist: Secondary | ICD-10-CM

## 2011-02-23 DIAGNOSIS — M129 Arthropathy, unspecified: Secondary | ICD-10-CM

## 2011-02-23 DIAGNOSIS — G5600 Carpal tunnel syndrome, unspecified upper limb: Secondary | ICD-10-CM

## 2011-02-23 DIAGNOSIS — M1990 Unspecified osteoarthritis, unspecified site: Secondary | ICD-10-CM

## 2011-02-23 MED ORDER — MELOXICAM 15 MG PO TABS: 15.0000 mg | ORAL_TABLET | Freq: Every day | ORAL | Status: DC

## 2011-02-23 NOTE — Patient Instructions (Signed)
It was nice to meet you.  I want you to wear this wrist splint for your pain.  I also want you to take mobic once a day for the inflammation.    Please come back in one month so we can see if you are improving.

## 2011-02-23 NOTE — Progress Notes (Signed)
  Subjective:    Patient ID: Sierra Bell, female    DOB: 1945-12-22, 66 y.o.   MRN: [id number]  HPI  [place] comes in for right wrist pain.  She does not speak much english, but a family member is with her and is translating.  Her wrist has been hurting on and off for 6 months.  Sometimes she has swelling in the wrist.  It hurts sometimes at night, and she has shooting pains into her hand, and up to her elbow and shoulder.  She cannot name any movement in particular that hurts it.  No injury.    Review of Systems Pertinent items noted in HPI.     Objective:   Physical Exam BP 116/74  Pulse 76  Ht 5\' 4"  (1.626 m)  Wt 160 lb (72.576 kg)  BMI 27.46 kg/m2 General appearance: alert, cooperative and no distress  Wrist: Inspection of left normal with no visible erythema or swelling.  Some mild swelling of ulnar syloid process.  ROM smooth and normal with good flexion and extension and ulnar/radial deviation that is symmetrical bilaterally.  Palpation is normal over metacarpals, navicular, lunate, and TFCC; tendons without tenderness/ swelling Strength 5/5 in all directions without pain. Positive Finkelstein, tinel's in right wrist, negative in left.  MSK [place] of right wrist:  No obvious arthritic changes or tendon abnormalities in compartments 1-5. There is swelling in compartment 6.  Median nerve measures .22 mm.       Assessment & Plan:

## 2011-02-23 NOTE — Assessment & Plan Note (Signed)
Most likely due to carpel tunnel.  No obvious arthritis or tendon injuries on [place].

## 2011-02-23 NOTE — Assessment & Plan Note (Addendum)
rx for mobic, pt to schedule for one week then PRN.  Wrist splint fitted today.  Instructed to wear at night and during daily activities that aggravate it. Pt to follow up in 1 month.   Consider injectin if not improved

## 2011-03-23 ENCOUNTER — Ambulatory Visit (INDEPENDENT_AMBULATORY_CARE_PROVIDER_SITE_OTHER): Payer: Self-pay | Admitting: Sports Medicine

## 2011-03-23 ENCOUNTER — Ambulatory Visit: Payer: Self-pay | Admitting: Sports Medicine

## 2011-03-23 VITALS — BP 110/60

## 2011-03-23 DIAGNOSIS — M25539 Pain in unspecified wrist: Secondary | ICD-10-CM

## 2011-03-23 DIAGNOSIS — G5600 Carpal tunnel syndrome, unspecified upper limb: Secondary | ICD-10-CM

## 2011-03-23 DIAGNOSIS — M1990 Unspecified osteoarthritis, unspecified site: Secondary | ICD-10-CM

## 2011-03-23 DIAGNOSIS — G5601 Carpal tunnel syndrome, right upper limb: Secondary | ICD-10-CM

## 2011-03-23 DIAGNOSIS — M129 Arthropathy, unspecified: Secondary | ICD-10-CM

## 2011-03-23 MED ORDER — MELOXICAM 15 MG PO TABS: 7.5000 mg | ORAL_TABLET | Freq: Every day | ORAL | Status: AC

## 2011-03-23 NOTE — Assessment & Plan Note (Addendum)
Will continue with wrist splint with addition of wrist loop during the day to help with lateral wrist pain.  Continue NSAIDs. Discussed steroid injection. Pt agreeable to deferring this to follow up visit.  Will follow up in 4-6 weeks.

## 2011-03-23 NOTE — Patient Instructions (Signed)
I am starting you on a wrist loop for your wrist to wear during the day Keep wearing the wrist splint at night  Continue taking the mobic Come back in 6 weeks if not better.

## 2011-03-23 NOTE — Assessment & Plan Note (Addendum)
Noted ulnar styloid TTP. Will place pt in wrist loop during the day.   Possibly some element of TFCC or ECU tendinopathy with pain and hypoechoic change on [place]  Use wrist loop for actiivty

## 2011-03-23 NOTE — Progress Notes (Signed)
  Subjective:    Patient ID: Sierra Bell, female    DOB: Jan 22, 1946, 66 y.o.   MRN: [id number]  HPI Pt is here for follow up of R wrist pain. Pt was formally diagnosed with carpal tunnel syndrome 02/23/11. Pt was placed in wrist splint as well as mobic for pain.  Pt speaks very little to no english. Pt has family friend here today to help translate.  Per friend, pt has had pain that seems to be exacerbated by wearing wrist splint, but relieved with taking splint off. Pt has been using mobic for pain with mild improvement in sxs but got pretty good relief when she used it consistently. Per friend, pt also had some lateral wrist pain since last clinical visit. No redness, swelling, or numbness.   Note - last US showed Median N of 0.22 (ULN is 0.11) and hypoechoic change over compartment 6 Review of Systems See HPI, otherwise ROS negative    Objective:   Physical Exam General appearance: alert, cooperative and no distress  MSK/Wrist:  Inspection of left normal with no visible erythema or swelling. Some mild swelling of ulnar styloid process.  ROM smooth and normal with good flexion and extension and ulnar/radial deviation that is symmetrical bilaterally.  Palpation is normal over metacarpals, navicular, lunate, and TFCC; tendons without tenderness/ swelling  Strength 5/5 in all directions without pain.  Positive phalen and  tinel's in right wrist, negative in left.        Assessment & Plan:

## 2011-04-06 ENCOUNTER — Ambulatory Visit: Payer: Self-pay | Admitting: Sports Medicine

## 2011-04-20 ENCOUNTER — Ambulatory Visit (INDEPENDENT_AMBULATORY_CARE_PROVIDER_SITE_OTHER): Payer: Self-pay | Admitting: Sports Medicine

## 2011-04-20 VITALS — BP 126/70

## 2011-04-20 DIAGNOSIS — M25539 Pain in unspecified wrist: Secondary | ICD-10-CM

## 2011-04-20 NOTE — Assessment & Plan Note (Signed)
I think that her principal pathology is synovial thickening, and tenosynovitis of her extensor carpi ulnaris tendon. Ultrasound guided injection as above. I do not suspect carpal tunnel syndrome at this time. She does have some clinical signs that hint at cervical radiculopathy and if she does not get better after the injection I would suggest further investigating her cervical spine with x-rays.

## 2011-04-20 NOTE — Progress Notes (Signed)
  Subjective:    Patient ID: Sierra Bell, female    DOB: 04-30-1945, 66 y.o.   MRN: [id number]  HPI [place] comes in for followup of right wrist pain that she's had for decades. She used to do a lot of cooking, and a lot of repetitive wrist motion. She localizes her pain to the ulnar aspect of the wrist both proximal and distal to the styloid process. She notes is worse with movement of the wrist. She denies any numbness or tingling into her fingertips and denies dropping objects unintentionally, but does note that the pain sometimes radiates all the way up to her shoulder. She is using a meloxicam, as well as both a wrist splint and a wrist loop without any improvement.   Review of Systems    No fevers, chills, night sweats, weight loss, chest pain, or shortness of breath.  Social History: Non-smoker. Objective:   Physical Exam General:  Well developed, well nourished, and in no acute distress. Neuro:  Alert and oriented x3, extra-ocular muscles intact. Skin: Warm and dry, no rashes noted. Respiratory:  Not using accessory muscles, speaking in full sentences. Musculoskeletal: Right Wrist: Inspection normal with no visible erythema or swelling. ROM smooth and normal with good flexion and extension and ulnar/radial deviation that is symmetrical with opposite wrist. Palpation is normal over metacarpals, navicular, lunate, and TFCC. Negative Phalen's, Tinel's, median nerve compression tests. Very tender to palpation over the extensor carpi ulnaris tendon, pain with resisted ulnar deviation of the wrist. No snuffbox tenderness. No tenderness over Canal of [place]. Strength 5/5 in all directions without pain. Negative Finkelstein, tinel's and phalens. Negative Watson's test.  Spurling's test does reproduce some symptoms down the arm. No neck tenderness, strength, sensation, and reflexes are all symmetric in the upper extremities. Negative Hoffmann sign bilateral.  Real-time  Ultrasound Guided Injection of: Right extensor carpi ulnaris tendon sheath Ultrasound guided injection is preferred based studies that show increased duration, increased effect, greater accuracy, decreased procedural pain, increased response rate, and decreased cost with ultrasound guided versus blind injection. Consent obtained. Time-out conducted. Noted no overlying erythema, induration, or other signs of local infection. Skin prepped in a sterile fashion. Local anesthesia: Topical Ethyl chloride. With sterile technique and under real time ultrasound guidance: Needle directed in a long axis into the extensor carpi ulnaris tendon sheath where there was a massive amount of synovial sheath thickening seen. 1 cc Depo-Medrol, 2 cc lidocaine were injected into the tendon sheath. Completed without difficulty Pain immediately resolved suggesting accurate placement of the medication. Advised to call if fevers/chills, erythema, induration, drainage, or persistent bleeding. Images saved.    Assessment & Plan:

## 2011-05-20 ENCOUNTER — Encounter: Payer: Self-pay | Admitting: Family Medicine

## 2011-05-20 ENCOUNTER — Ambulatory Visit (INDEPENDENT_AMBULATORY_CARE_PROVIDER_SITE_OTHER): Payer: Self-pay | Admitting: Family Medicine

## 2011-05-20 VITALS — BP 102/68 | HR 67 | Temp 97.9°F | Ht 64.0 in | Wt 154.4 lb

## 2011-05-20 DIAGNOSIS — K044 Acute apical periodontitis of pulpal origin: Secondary | ICD-10-CM

## 2011-05-20 DIAGNOSIS — K589 Irritable bowel syndrome without diarrhea: Secondary | ICD-10-CM

## 2011-05-20 DIAGNOSIS — K047 Periapical abscess without sinus: Secondary | ICD-10-CM | POA: Insufficient documentation

## 2011-05-20 MED ORDER — DICYCLOMINE HCL 10 MG PO CAPS: 10.0000 mg | ORAL_CAPSULE | Freq: Two times a day (BID) | ORAL | Status: AC

## 2011-05-20 MED ORDER — CLINDAMYCIN HCL 300 MG PO CAPS: 300.0000 mg | ORAL_CAPSULE | Freq: Three times a day (TID) | ORAL | Status: AC

## 2011-05-20 NOTE — Assessment & Plan Note (Signed)
Still having the same recurrent abdominal pain. I believe this is irritable bowel syndrome. I will try bentyl.

## 2011-05-20 NOTE — Progress Notes (Signed)
  Subjective:    Patient ID: Sierra Bell, female    DOB: 1945/07/07, 66 y.o.   MRN: [id number]  HPI 1. Left jaw pain Patient has several week history of left jaw pain that is worsening. She has crepitus on movement of her jaw at both TMJ sites. No swelling or erythema/calor at both jaw/tmj sites. No temporal artery findings. She has a visible decaying tooth adjacent to her jaw pain with visible carrie.   2. IBS Patient is still experiencing abdominal colic symptoms. No fever. intermittent diarrhea and bowel cramping. Workup for gallbladder negative.   Tobacco use: Patient is a non-smoker. Review of Systems Pertinent items are noted in HPI.     Objective:   Physical Exam Filed Vitals:   05/20/11 1104  BP: 102/68  Pulse: 67  Temp: 97.9 F (36.6 C)  TempSrc: Oral  Height: 5\' 4"  (1.626 m)  Weight: 154 lb 6.4 oz (70.035 kg)  Mouth - no lesions, mucous membranes are moist, decaying left lower teeth  Neck:  No deformities, thyromegaly, masses, or tenderness noted.   Supple with full range of motion without pain.    Assessment & Plan:

## 2011-05-20 NOTE — Assessment & Plan Note (Signed)
Patient has pain in the left side of her jaw and obvious dental carrie. No abscess or signs of inflammation.  Will treat with 7 days of clindamycin. She needs to see a dentist to have her teeth checked.

## 2011-05-20 NOTE — Patient Instructions (Addendum)
It was great to see you today!  Schedule an appointment to see me as needed.  Meds ordered this encounter  Medications  . clindamycin (CLEOCIN) 300 MG capsule    Sig: Take 1 capsule (300 mg total) by mouth 3 (three) times daily.    Dispense:  21 capsule    Refill:  0  I have prescribed this antibiotic for your dental infection.  You need to see the dentist to have this looked at, you may need to have a tooth pulled.   Meds ordered this encounter  Medications  . clindamycin (CLEOCIN) 300 MG capsule    Sig: Take 1 capsule (300 mg total) by mouth 3 (three) times daily.    Dispense:  21 capsule    Refill:  0  . dicyclomine (BENTYL) 10 MG capsule    Sig: Take 1 capsule (10 mg total) by mouth 3 (three) times daily - between meals and at bedtime.    Dispense:  60 capsule    Refill:  5   I have also prescribed Bentyl, which will help with your abdominal cramping related to your irritable bowel syndrome.

## 2011-05-27 ENCOUNTER — Telehealth: Payer: Self-pay | Admitting: *Deleted

## 2011-05-27 NOTE — Telephone Encounter (Signed)
LVM for patient to call back to inform her of the fax received confirming dental appointment. Patient's appointment is 06/28/2011 @ 9:15am. States that patient has been notified,just wanted to confirm this.

## 2011-06-01 ENCOUNTER — Ambulatory Visit: Payer: Self-pay | Admitting: Sports Medicine

## 2011-06-15 ENCOUNTER — Ambulatory Visit (INDEPENDENT_AMBULATORY_CARE_PROVIDER_SITE_OTHER): Payer: Self-pay | Admitting: Sports Medicine

## 2011-06-15 ENCOUNTER — Encounter: Payer: Self-pay | Admitting: Sports Medicine

## 2011-06-15 VITALS — BP 125/80 | HR 60

## 2011-06-15 DIAGNOSIS — M25539 Pain in unspecified wrist: Secondary | ICD-10-CM

## 2011-06-15 NOTE — Progress Notes (Signed)
  Subjective:    Patient ID: Sierra Bell, female    DOB: 09-14-1945, 66 y.o.   MRN: [id number]  HPI Sierra Bell comes back for followup of a right extensor carpi ulnaris ultrasound guided injection performed at the last visit. She's overall 100% better, and very happy with the results.   Review of Systems    No fevers, chills, night sweats, weight loss, chest pain, or shortness of breath.  Social History: Non-smoker. Objective:   Physical Exam General:  Well developed, well nourished, and in no acute distress. Neuro:  Alert and oriented x3, extra-ocular muscles intact. Skin: Warm and dry, no rashes noted. Respiratory:  Not using accessory muscles, speaking in full sentences. Musculoskeletal: Right Wrist: Inspection normal with no visible erythema or swelling. ROM smooth and normal with good flexion and extension and ulnar/radial deviation that is symmetrical with opposite wrist. Palpation is normal over metacarpals, navicular, lunate, and TFCC; tendons without tenderness/ swelling No snuffbox tenderness. No tenderness over Canal of [place]. Strength 5/5 in all directions without pain. Negative Finkelstein, tinel's and phalens. Negative Watson's test.     Assessment & Plan:

## 2011-06-15 NOTE — Assessment & Plan Note (Signed)
Cured status post ultrasound guided injection into the right extensor carpi ulnaris tendon sheath at the last visit. She may use anti-inflammatories as needed. She can come back to see me as needed.

## 2011-08-19 ENCOUNTER — Encounter: Payer: Self-pay | Admitting: Family Medicine

## 2011-08-19 ENCOUNTER — Ambulatory Visit (INDEPENDENT_AMBULATORY_CARE_PROVIDER_SITE_OTHER): Payer: Self-pay | Admitting: Family Medicine

## 2011-08-19 VITALS — BP 117/77 | HR 70 | Temp 97.5°F | Ht 64.0 in | Wt 156.3 lb

## 2011-08-19 DIAGNOSIS — R519 Headache, unspecified: Secondary | ICD-10-CM | POA: Insufficient documentation

## 2011-08-19 DIAGNOSIS — R51 Headache: Secondary | ICD-10-CM | POA: Insufficient documentation

## 2011-08-19 MED ORDER — NAPROXEN 500 MG PO TABS: 500.0000 mg | ORAL_TABLET | Freq: Two times a day (BID) | ORAL | Status: AC | PRN

## 2011-08-19 MED ORDER — HYDROCHLOROTHIAZIDE 25 MG PO TABS: 25.0000 mg | ORAL_TABLET | Freq: Every day | ORAL | Status: AC

## 2011-08-19 MED ORDER — LISINOPRIL 40 MG PO TABS: 40.0000 mg | ORAL_TABLET | Freq: Every day | ORAL | Status: AC

## 2011-08-19 NOTE — Patient Instructions (Addendum)
Take the new prescription (Naproxen) twice daily with meals as needed.  I have given you a 90 day supply of your blood pressure medications.  I will give you a call if your lab test is abnormal.  Schedule follow up appointment before you leave for [place].

## 2011-08-19 NOTE — Assessment & Plan Note (Signed)
Work up - ESR done today to exclude Temporal arteritis. Will call patient if abnormal result.  Patient started on Naproxen twice daily with meals PRN for headache and jaw pain.

## 2011-08-19 NOTE — Progress Notes (Signed)
Subjective:     Patient ID: Sierra Bell, female   DOB: Jun 20, 1945, 66 y.o.   MRN: [id number]  HPI  66 year old presents for follow up regarding headache.  Patient is Sierra Bell and relative present for interpretation.  1) Headache/Jaw Pain  - Previous saw Dr. Rivka Bell for this problem.  He felt that it was related to a dental abscess.  Her pain has continued despite remove of tooth and antibiotic therapy.   - She has right jaw pain and headache that has been going on for approximately 5 months.  It only occurs at night and awakens the patient from sleep.  It is described as throbbing pain of moderate severity.  Not aided by tylenol use.    Review of Systems No vision loss, nausea, vomiting.  No photophobia or phonophobia.  No jaw claudication.  No fevers, chills.     Objective:   Physical Exam General:  Alert and oriented.  HEENT:  Normocephalic, atraumatic.  No temporal wasting appreciated.  No clicking, popping, or grinding with jaw motion.  Normal TM's.  No dental caries noted.   Heart:  RRR. No murmurs, rubs, or gallops. Chest: CTAB. Optho: Normal fundus.  No evidence of papilledema. Neuro:  5/5 Upper Extremity strength.  Biceps/Triceps reflex - 2+.  No sensory deficits.     Assessment:      Plan:

## 2011-08-20 LAB — SEDIMENTATION RATE: Sed Rate: 9 mm/hr (ref 0–22)

## 2011-09-02 ENCOUNTER — Ambulatory Visit: Payer: Self-pay | Admitting: Family Medicine

## 2012-11-08 ENCOUNTER — Other Ambulatory Visit: Payer: Self-pay

## 2012-11-08 DIAGNOSIS — Z1231 Encounter for screening mammogram for malignant neoplasm of breast: Secondary | ICD-10-CM

## 2012-11-12 ENCOUNTER — Ambulatory Visit
Admission: RE | Admit: 2012-11-12 | Discharge: 2012-11-12 | Disposition: A | Discharge status: Home / Self Care | Payer: Medicaid Other | Source: Ambulatory Visit

## 2012-11-12 DIAGNOSIS — Z1231 Encounter for screening mammogram for malignant neoplasm of breast: Secondary | ICD-10-CM

## 2013-02-28 ENCOUNTER — Encounter: Payer: Self-pay | Admitting: Gastroenterology

## 2013-04-02 ENCOUNTER — Encounter: Payer: Medicaid Other | Admitting: Gastroenterology

## 2014-08-05 ENCOUNTER — Other Ambulatory Visit: Payer: Self-pay

## 2014-08-05 DIAGNOSIS — Z1231 Encounter for screening mammogram for malignant neoplasm of breast: Secondary | ICD-10-CM

## 2014-08-08 ENCOUNTER — Ambulatory Visit: Payer: Medicaid Other

## 2014-08-19 ENCOUNTER — Ambulatory Visit
Admission: RE | Admit: 2014-08-19 | Discharge: 2014-08-19 | Disposition: A | Discharge status: Home / Self Care | Payer: Medicaid Other | Source: Ambulatory Visit

## 2014-08-19 DIAGNOSIS — Z1231 Encounter for screening mammogram for malignant neoplasm of breast: Secondary | ICD-10-CM

## 2014-09-16 ENCOUNTER — Ambulatory Visit (INDEPENDENT_AMBULATORY_CARE_PROVIDER_SITE_OTHER): Payer: Medicaid Other | Admitting: Sports Medicine

## 2014-09-16 ENCOUNTER — Encounter: Payer: Self-pay | Admitting: Sports Medicine

## 2014-09-16 VITALS — BP 132/67 | HR 74 | Ht 64.0 in | Wt 155.0 lb

## 2014-09-16 DIAGNOSIS — M25512 Pain in left shoulder: Secondary | ICD-10-CM | POA: Diagnosis present

## 2014-09-16 MED ORDER — METHYLPREDNISOLONE ACETATE 40 MG/ML IJ SUSP
40.0000 mg | Freq: Once | INTRAMUSCULAR | Status: AC
  Administered 2014-09-16: 40 mg via INTRA_ARTICULAR

## 2014-09-16 NOTE — Progress Notes (Signed)
   Subjective:    Patient ID: Sierra Bell, female    DOB: Jun 11, 1945, 69 y.o.   MRN: [id number]  HPI chief complaint: Left shoulder pain  69 year old female resents complaining of diffuse left shoulder pain for the past 6 months. No trauma but a gradual onset of pain that is present primarily with activity. She has noticed limited range of motion due to her pain is well. No numbness or tingling. She has tried Tylenol without much relief. No similar problems in the past. No prior shoulder surgery. History is obtained through an interpreter.  Past medical history reviewed Medications reviewed Allergies reviewed    Review of Systems    as above Objective:   Physical Exam Well-developed, well-nourished. No acute distress  Left shoulder demonstrates limited active range of motion in all planes secondary to pain. Full passive range of motion. No gross deformity. Global weakness secondary to pain. No atrophy. Neurovascularly intact distally.       Assessment & Plan:  Left shoulder pain likely secondary to subacromial bursitis versus rotator cuff tendinitis/tendinopathy  I discussed treatment options with the patient including oral anti-inflammatories and cortisone injections. She would like to proceed with the cortisone injection. This was done after risks and benefits were explained. Patient tolerated procedure without difficulty. If symptoms persist patient will return to the office for reevaluation and consideration of further workup. Otherwise, follow-up as needed.   Consent obtained and verified. Time-out conducted. Noted no overlying erythema, induration, or other signs of local infection. Skin prepped in a sterile fashion. Topical analgesic spray: Ethyl chloride. Joint: left shoulder (subacromial) Needle: 25g 1.5 inch Completed without difficulty. Meds: 3cc 1% xylocaine, 1cc ( ) depomedrol  Advised to call if fevers/chills, erythema, induration, drainage, or persistent  bleeding.

## 2014-10-08 ENCOUNTER — Encounter: Payer: Self-pay | Admitting: Sports Medicine

## 2014-10-08 ENCOUNTER — Ambulatory Visit (INDEPENDENT_AMBULATORY_CARE_PROVIDER_SITE_OTHER): Payer: Medicaid Other | Admitting: Sports Medicine

## 2014-10-08 VITALS — BP 117/66 | Ht 64.0 in | Wt 155.0 lb

## 2014-10-08 DIAGNOSIS — M25512 Pain in left shoulder: Secondary | ICD-10-CM | POA: Diagnosis present

## 2014-10-08 MED ORDER — MELOXICAM 15 MG PO TABS: ORAL_TABLET | ORAL | Status: AC

## 2014-10-08 NOTE — Progress Notes (Signed)
   Subjective:    Patient ID: Sierra Bell, female    DOB: 1945-06-01, 69 y.o.   MRN: [id number]  HPI  Patient comes in today for follow-up on left shoulder pain. She does feel like the cortisone injection was helpful. Still having some residual pain but her motion has improved. History is obtained with help of an interpreter.    Review of Systems     Objective:   Physical Exam Well-developed, well-nourished. No acute distress.  Left shoulder: Active forward flexion to 150. Active abduction to 90. Good internal rotation. External rotation limited to about 60. Good passive range of motion. Fairly good cuff strength. Neurovascularly intact distally.       Assessment & Plan:  Left shoulder pain likely secondary to rotator cuff tendinopathy  Patient symptoms have improved after recent cortisone injection. I would like to put her on meloxicam for the next 7 days. She will take this with food. Follow-up with me in another 3 weeks. If symptoms persist consider imaging initially in the form of plain x-rays. Call with questions or concerns in the interim.

## 2014-10-29 ENCOUNTER — Ambulatory Visit (INDEPENDENT_AMBULATORY_CARE_PROVIDER_SITE_OTHER): Payer: Medicaid Other | Admitting: Sports Medicine

## 2014-10-29 ENCOUNTER — Encounter: Payer: Self-pay | Admitting: Sports Medicine

## 2014-10-29 VITALS — BP 137/68 | HR 68 | Ht 64.0 in | Wt 155.0 lb

## 2014-10-29 DIAGNOSIS — M25512 Pain in left shoulder: Secondary | ICD-10-CM | POA: Diagnosis present

## 2014-10-29 NOTE — Progress Notes (Signed)
   Subjective:    Patient ID: Sierra Bell, female    DOB: 05-31-1945, 69 y.o.   MRN: [id number]  HPI   Patient comes in today for follow-up on left shoulder pain. History is obtained with help of an interpreter. Left shoulder pain has resolved. She received a cortisone injection several weeks ago that was helpful. At her last office visit I gave her a prescription for meloxicam. That too has been helpful. She is currently taking it daily. She is without complaint.    Review of Systems     Objective:   Physical Exam Well-developed, well-nourished. No acute distress. Awake alert and oriented 3. Vital signs reviewed.  Left shoulder: Full painless range of motion. No tenderness to palpation. Negative empty can, negative Hawkins. Rotator cuff strength is 5/5 and does not cause any pain. Negative O'Brien's. No tenderness at the Cipro groove. Neurovascularly intact distally.       Assessment & Plan:  Improved left shoulder pain secondary to rotator cuff tendinopathy  I would like for the patient to wean to using the meloxicam on an as-needed basis. She may continue with activity as tolerated. Follow-up as needed.

## 2014-10-29 NOTE — Progress Notes (Signed)
Patient ID: Sierra Bell, female   DOB: 1945/04/17, 69 y.o.   MRN: [id number]  Interpreter for visit is Cristal Deer

## 2016-11-16 ENCOUNTER — Other Ambulatory Visit: Payer: Self-pay | Admitting: Internal Medicine

## 2016-11-16 DIAGNOSIS — Z1231 Encounter for screening mammogram for malignant neoplasm of breast: Secondary | ICD-10-CM

## 2016-12-07 ENCOUNTER — Ambulatory Visit
Admission: RE | Admit: 2016-12-07 | Discharge: 2016-12-07 | Disposition: A | Discharge status: Home / Self Care | Payer: Medicaid Other | Source: Ambulatory Visit | Attending: Internal Medicine | Admitting: Internal Medicine

## 2016-12-07 DIAGNOSIS — Z1231 Encounter for screening mammogram for malignant neoplasm of breast: Secondary | ICD-10-CM

## 2018-02-02 ENCOUNTER — Other Ambulatory Visit: Payer: Self-pay | Admitting: Internal Medicine

## 2018-02-02 DIAGNOSIS — Z1231 Encounter for screening mammogram for malignant neoplasm of breast: Secondary | ICD-10-CM

## 2018-03-07 ENCOUNTER — Ambulatory Visit
Admission: RE | Admit: 2018-03-07 | Discharge: 2018-03-07 | Disposition: A | Discharge status: Home / Self Care | Payer: Medicaid Other | Source: Ambulatory Visit | Attending: Internal Medicine | Admitting: Internal Medicine

## 2018-03-07 DIAGNOSIS — Z1231 Encounter for screening mammogram for malignant neoplasm of breast: Secondary | ICD-10-CM

## 2019-09-11 IMAGING — MG DIGITAL SCREENING BILATERAL MAMMOGRAM WITH CAD
4 series · 4 of 4 positions shown · non-contrast
Comparison: Previous exam(s).

CLINICAL DATA: Screening.

EXAM:
DIGITAL SCREENING BILATERAL MAMMOGRAM WITH CAD

[R CC]
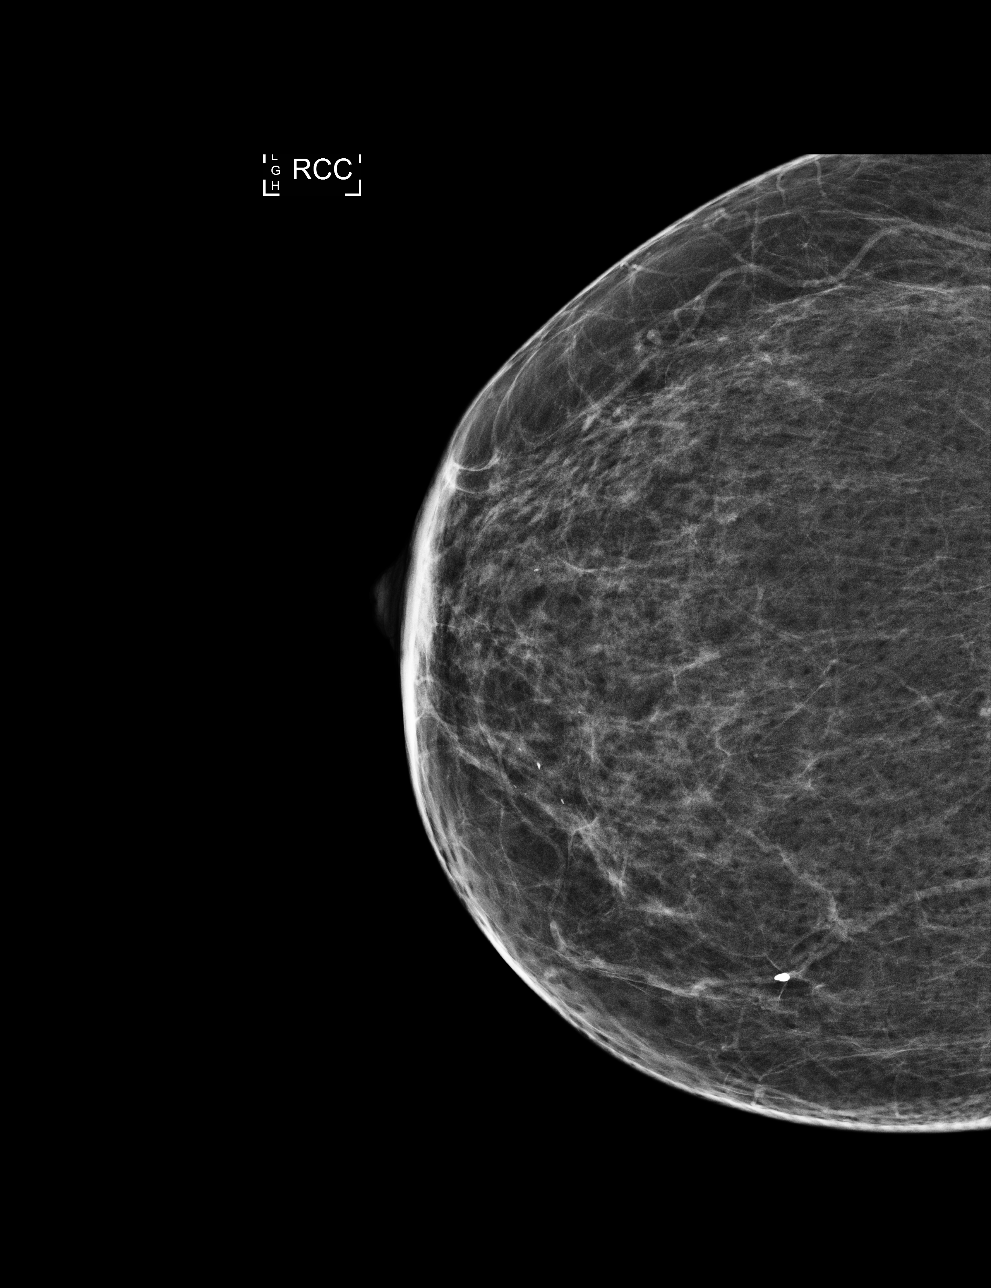

[L MLO]
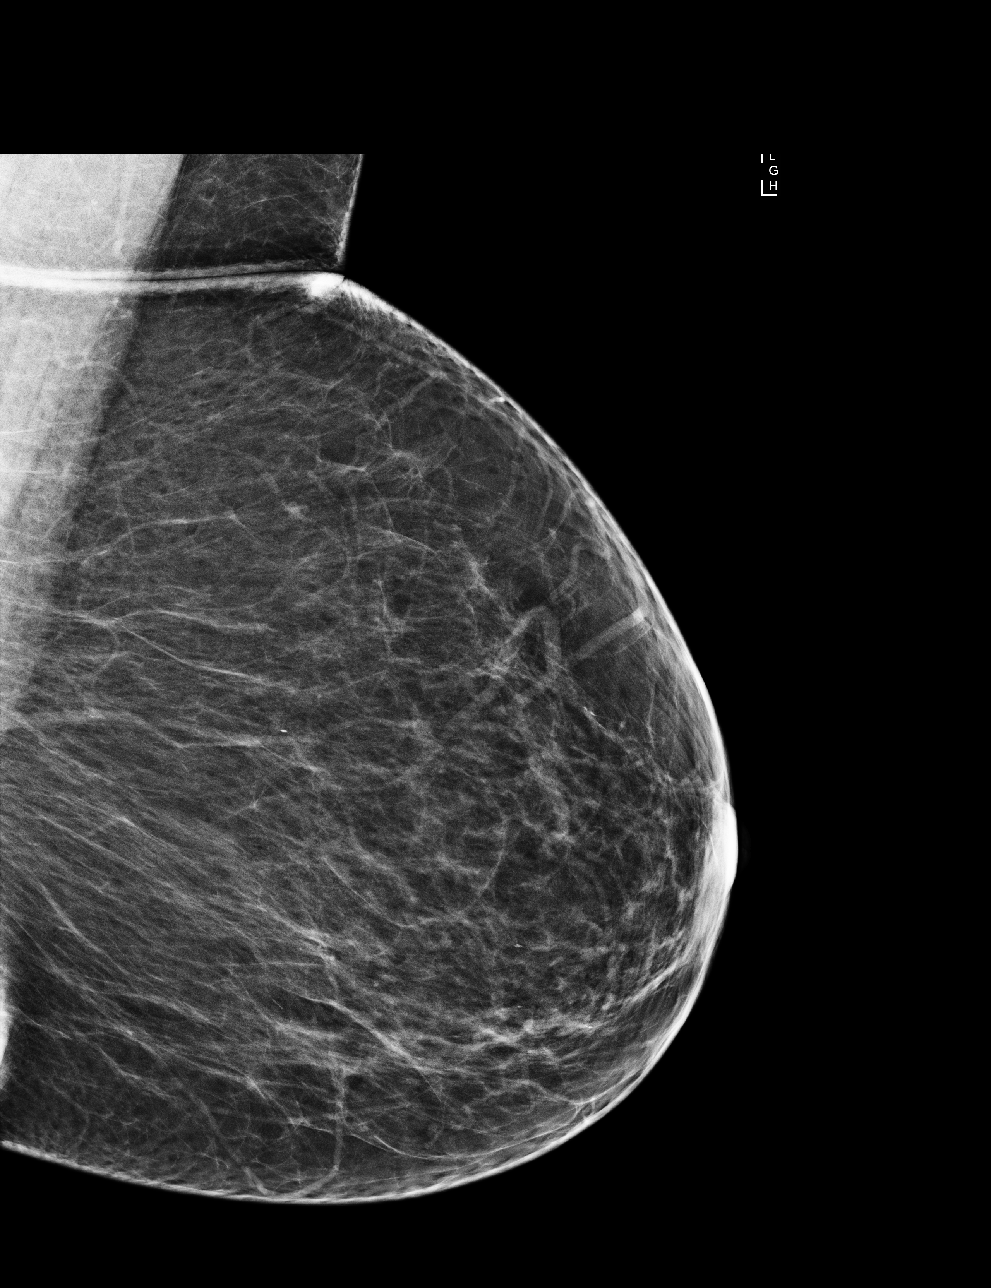

[L CC]
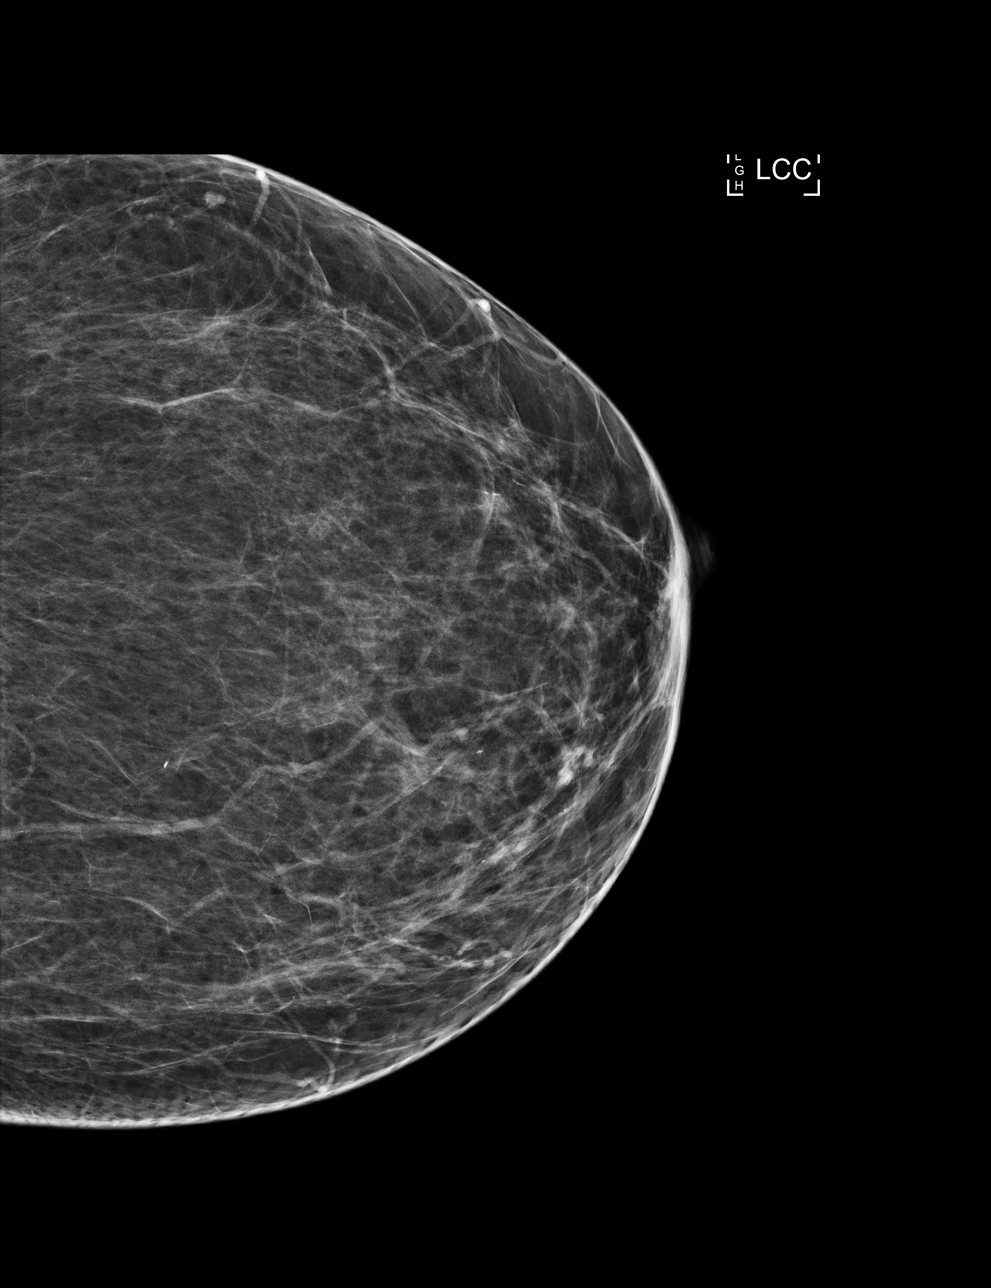

[R MLO]
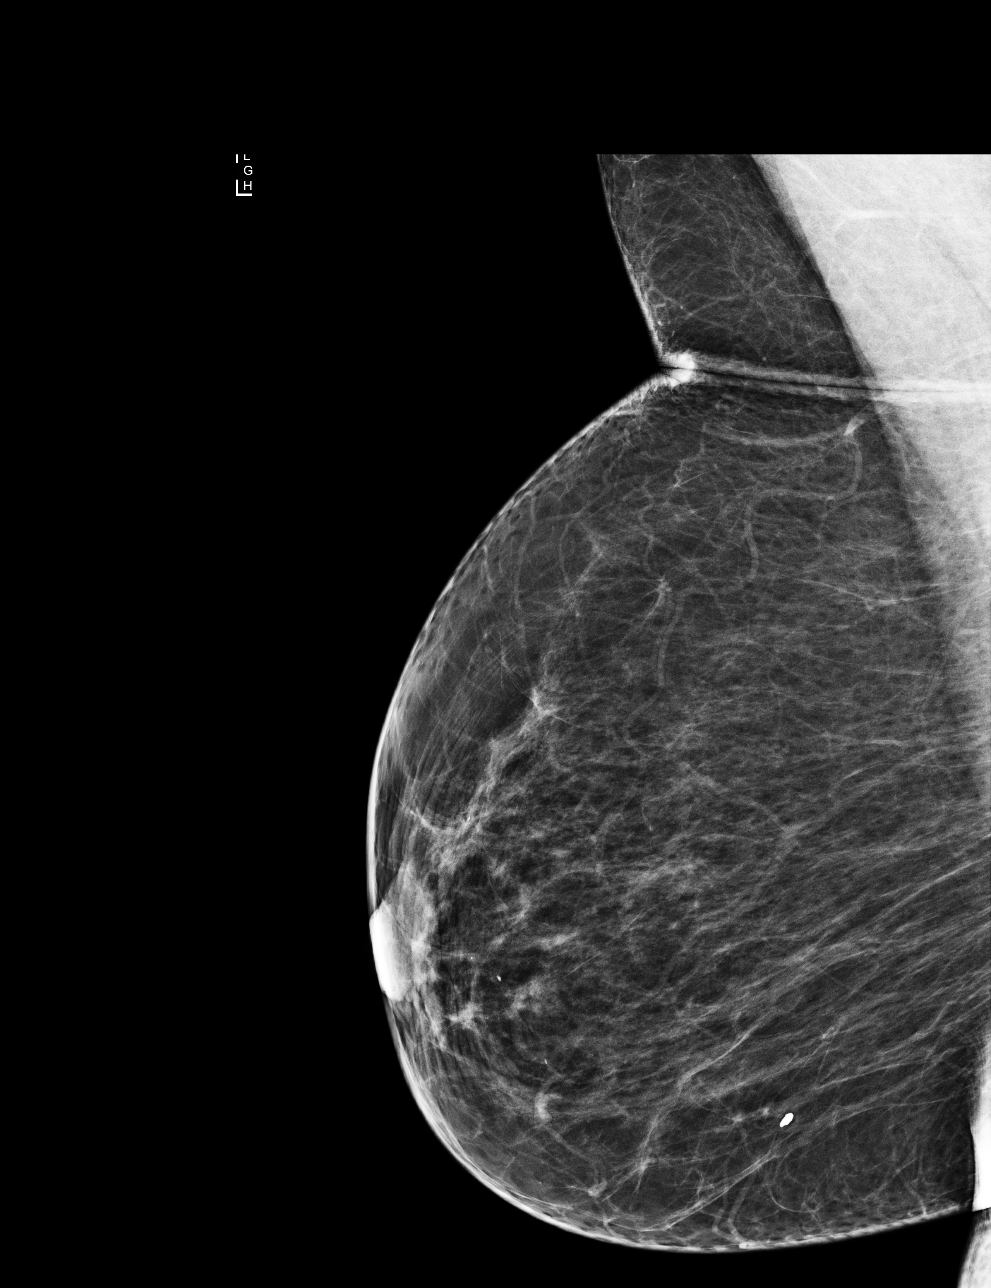

[4 of 4 positions shown; findings below may reference images not displayed]

ACR Breast Density Category b: There are scattered areas of
fibroglandular density.
FINDINGS: There are no findings suspicious for malignancy. Images were
processed with CAD.
IMPRESSION: No mammographic evidence of malignancy. A result letter of this
screening mammogram will be mailed directly to the patient.

RECOMMENDATION:
Screening mammogram in one year. (Code:AS-G-LCT)

BI-RADS CATEGORY  1: Negative.

## 2020-01-13 ENCOUNTER — Ambulatory Visit: Payer: Medicaid Other

## 2020-03-16 ENCOUNTER — Other Ambulatory Visit: Payer: Self-pay | Admitting: Internal Medicine

## 2020-03-17 LAB — LIPID PANEL
Cholesterol: 225 mg/dL — ABNORMAL HIGH (ref <200)
HDL: 48 mg/dL — ABNORMAL LOW (ref >=50)
LDL Cholesterol (Calc): 159 mg/dL (calc) — ABNORMAL HIGH
Non-HDL Cholesterol (Calc): 177 mg/dL (calc) — ABNORMAL HIGH (ref <130)
Total CHOL/HDL Ratio: 4.7 (calc) (ref <5.0)
Triglycerides: 80 mg/dL (ref <150)

## 2020-03-17 LAB — EXTRA LAV TOP TUBE

## 2020-03-30 ENCOUNTER — Telehealth: Payer: Self-pay

## 2020-03-30 NOTE — Telephone Encounter (Signed)
Received patient's colonoscopy and pathology report from [facility] 2017 will place In cabinet folder.

## 2021-03-19 ENCOUNTER — Other Ambulatory Visit: Payer: Self-pay | Admitting: Internal Medicine

## 2021-03-20 LAB — HEPATIC FUNCTION PANEL
AG Ratio: 1.3 (calc) (ref 1.0–2.5)
ALT: 13 U/L (ref 6–29)
AST: 20 U/L (ref 10–35)
Albumin: 4.5 g/dL (ref 3.6–5.1)
Alkaline phosphatase (APISO): 71 U/L (ref 37–153)
Bilirubin, Direct: 0.1 mg/dL (ref 0.0–0.2)
Globulin: 3.4 g/dL (calc) (ref 1.9–3.7)
Indirect Bilirubin: 0.6 mg/dL (calc) (ref 0.2–1.2)
Total Bilirubin: 0.7 mg/dL (ref 0.2–1.2)
Total Protein: 7.9 g/dL (ref 6.1–8.1)

## 2021-03-20 LAB — LIPID PANEL
Cholesterol: 152 mg/dL (ref <200)
HDL: 51 mg/dL (ref >=50)
LDL Cholesterol (Calc): 85 mg/dL (calc)
Non-HDL Cholesterol (Calc): 101 mg/dL (calc) (ref <130)
Total CHOL/HDL Ratio: 3 (calc) (ref <5.0)
Triglycerides: 69 mg/dL (ref <150)

## 2021-03-20 LAB — EXTRA LAV TOP TUBE

## 2022-10-03 ENCOUNTER — Other Ambulatory Visit: Payer: Self-pay | Admitting: Internal Medicine

## 2022-10-04 ENCOUNTER — Other Ambulatory Visit: Payer: Self-pay | Admitting: Internal Medicine

## 2022-10-04 DIAGNOSIS — E2839 Other primary ovarian failure: Secondary | ICD-10-CM

## 2022-10-04 LAB — CBC
HCT: 38.8 % (ref 35.0–45.0)
Hemoglobin: 12.8 g/dL (ref 11.7–15.5)
MCH: 30.3 pg (ref 27.0–33.0)
MCHC: 33 g/dL (ref 32.0–36.0)
MCV: 91.7 fL (ref 80.0–100.0)
MPV: 13.2 fL — ABNORMAL HIGH (ref 7.5–12.5)
Platelets: 139 10*3/uL — ABNORMAL LOW (ref 140–400)
RBC: 4.23 10*6/uL (ref 3.80–5.10)
RDW: 12.7 % (ref 11.0–15.0)
WBC: 5.9 10*3/uL (ref 3.8–10.8)

## 2022-10-04 LAB — COMPLETE METABOLIC PANEL WITH GFR
AG Ratio: 1.4 (calc) (ref 1.0–2.5)
ALT: 17 U/L (ref 6–29)
AST: 23 U/L (ref 10–35)
Albumin: 4.4 g/dL (ref 3.6–5.1)
Alkaline phosphatase (APISO): 68 U/L (ref 37–153)
BUN: 14 mg/dL (ref 7–25)
CO2: 23 mmol/L (ref 20–32)
Calcium: 9.4 mg/dL (ref 8.6–10.4)
Chloride: 104 mmol/L (ref 98–110)
Creat: 0.94 mg/dL (ref 0.60–1.00)
Globulin: 3.2 g/dL (calc) (ref 1.9–3.7)
Glucose, Bld: 74 mg/dL (ref 65–99)
Potassium: 3.7 mmol/L (ref 3.5–5.3)
Sodium: 140 mmol/L (ref 135–146)
Total Bilirubin: 0.5 mg/dL (ref 0.2–1.2)
Total Protein: 7.6 g/dL (ref 6.1–8.1)
eGFR: 62 mL/min/{1.73_m2} (ref >=60)

## 2022-10-04 LAB — LIPID PANEL
Cholesterol: 131 mg/dL (ref <200)
HDL: 49 mg/dL — ABNORMAL LOW (ref >=50)
LDL Cholesterol (Calc): 68 mg/dL
Non-HDL Cholesterol (Calc): 82 mg/dL (calc) (ref <130)
Total CHOL/HDL Ratio: 2.7 (calc) (ref <5.0)
Triglycerides: 59 mg/dL (ref <150)

## 2022-10-04 LAB — TSH: TSH: 1.96 m[IU]/L (ref 0.40–4.50)
